# Patient Record
Sex: Female | Born: 1975 | Race: White | Hispanic: Yes | Marital: Married | Smoking: Never smoker
Health system: Southern US, Community
[De-identification: ages and names within clinical notes are randomized; demographics above are authoritative.]

## PROBLEM LIST (undated history)

## (undated) DIAGNOSIS — I10 Essential (primary) hypertension: Secondary | ICD-10-CM

## (undated) DIAGNOSIS — T7840XA Allergy, unspecified, initial encounter: Secondary | ICD-10-CM

## (undated) DIAGNOSIS — E785 Hyperlipidemia, unspecified: Secondary | ICD-10-CM

## (undated) HISTORY — DX: Essential (primary) hypertension: I10

## (undated) HISTORY — DX: Hyperlipidemia, unspecified: E78.5

## (undated) HISTORY — DX: Allergy, unspecified, initial encounter: T78.40XA

---

## 2013-02-20 ENCOUNTER — Other Ambulatory Visit (HOSPITAL_COMMUNITY): Payer: Self-pay | Admitting: *Deleted

## 2013-02-20 ENCOUNTER — Ambulatory Visit (HOSPITAL_COMMUNITY)
Admission: RE | Admit: 2013-02-20 | Discharge: 2013-02-20 | Disposition: A | Discharge status: Home / Self Care | Payer: Self-pay | Source: Ambulatory Visit | Attending: *Deleted | Admitting: *Deleted

## 2013-02-20 DIAGNOSIS — R05 Cough: Secondary | ICD-10-CM | POA: Insufficient documentation

## 2013-02-20 DIAGNOSIS — R059 Cough, unspecified: Secondary | ICD-10-CM | POA: Insufficient documentation

## 2013-02-20 DIAGNOSIS — R918 Other nonspecific abnormal finding of lung field: Secondary | ICD-10-CM | POA: Insufficient documentation

## 2013-02-24 ENCOUNTER — Other Ambulatory Visit (HOSPITAL_COMMUNITY): Payer: Self-pay | Admitting: *Deleted

## 2013-02-24 ENCOUNTER — Ambulatory Visit (HOSPITAL_COMMUNITY)
Admission: RE | Admit: 2013-02-24 | Discharge: 2013-02-24 | Disposition: A | Discharge status: Home / Self Care | Payer: Self-pay | Source: Ambulatory Visit | Attending: *Deleted | Admitting: *Deleted

## 2013-02-24 DIAGNOSIS — J984 Other disorders of lung: Secondary | ICD-10-CM | POA: Insufficient documentation

## 2013-02-24 DIAGNOSIS — R9389 Abnormal findings on diagnostic imaging of other specified body structures: Secondary | ICD-10-CM

## 2015-02-24 IMAGING — CR DG CHEST 2V
2 series · 2 of 2 positions shown · non-contrast
Comparison: None.

CLINICAL DATA: Cough.

EXAM:
CHEST  2 VIEW

[view not recorded (1 of 2)]
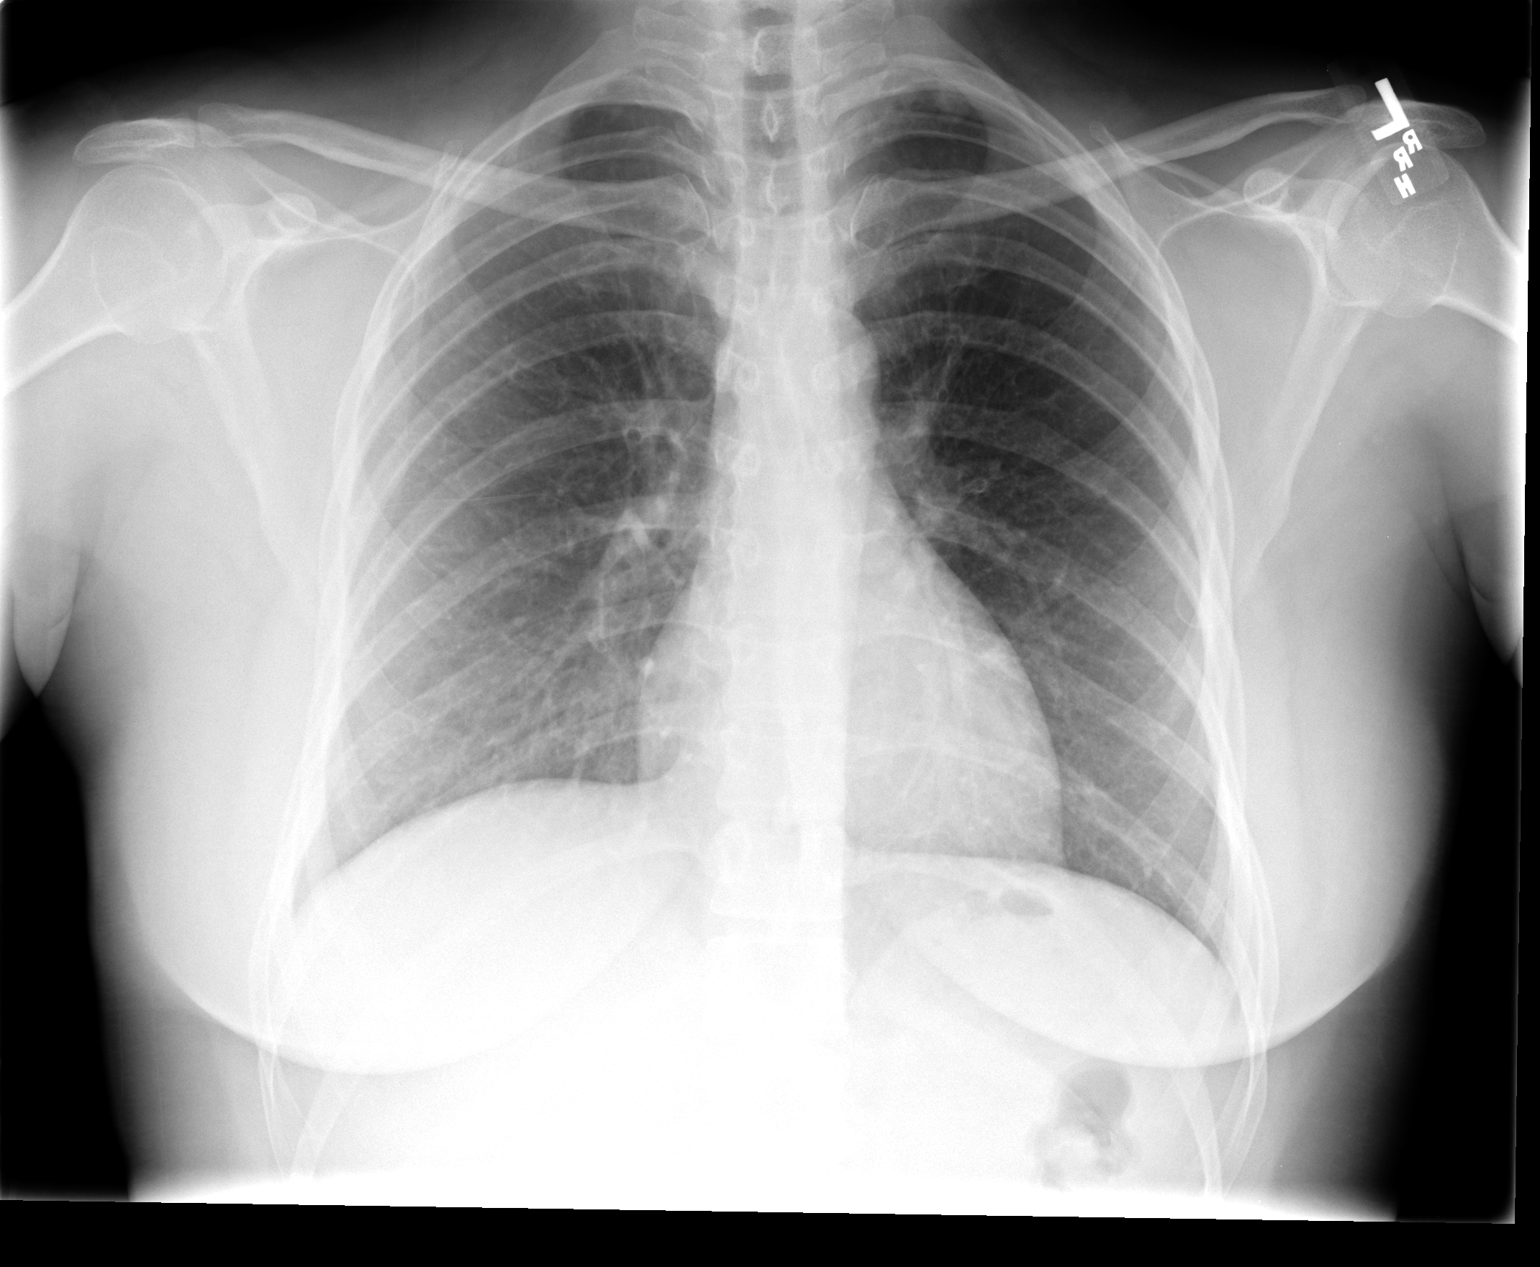

[view not recorded (2 of 2)]
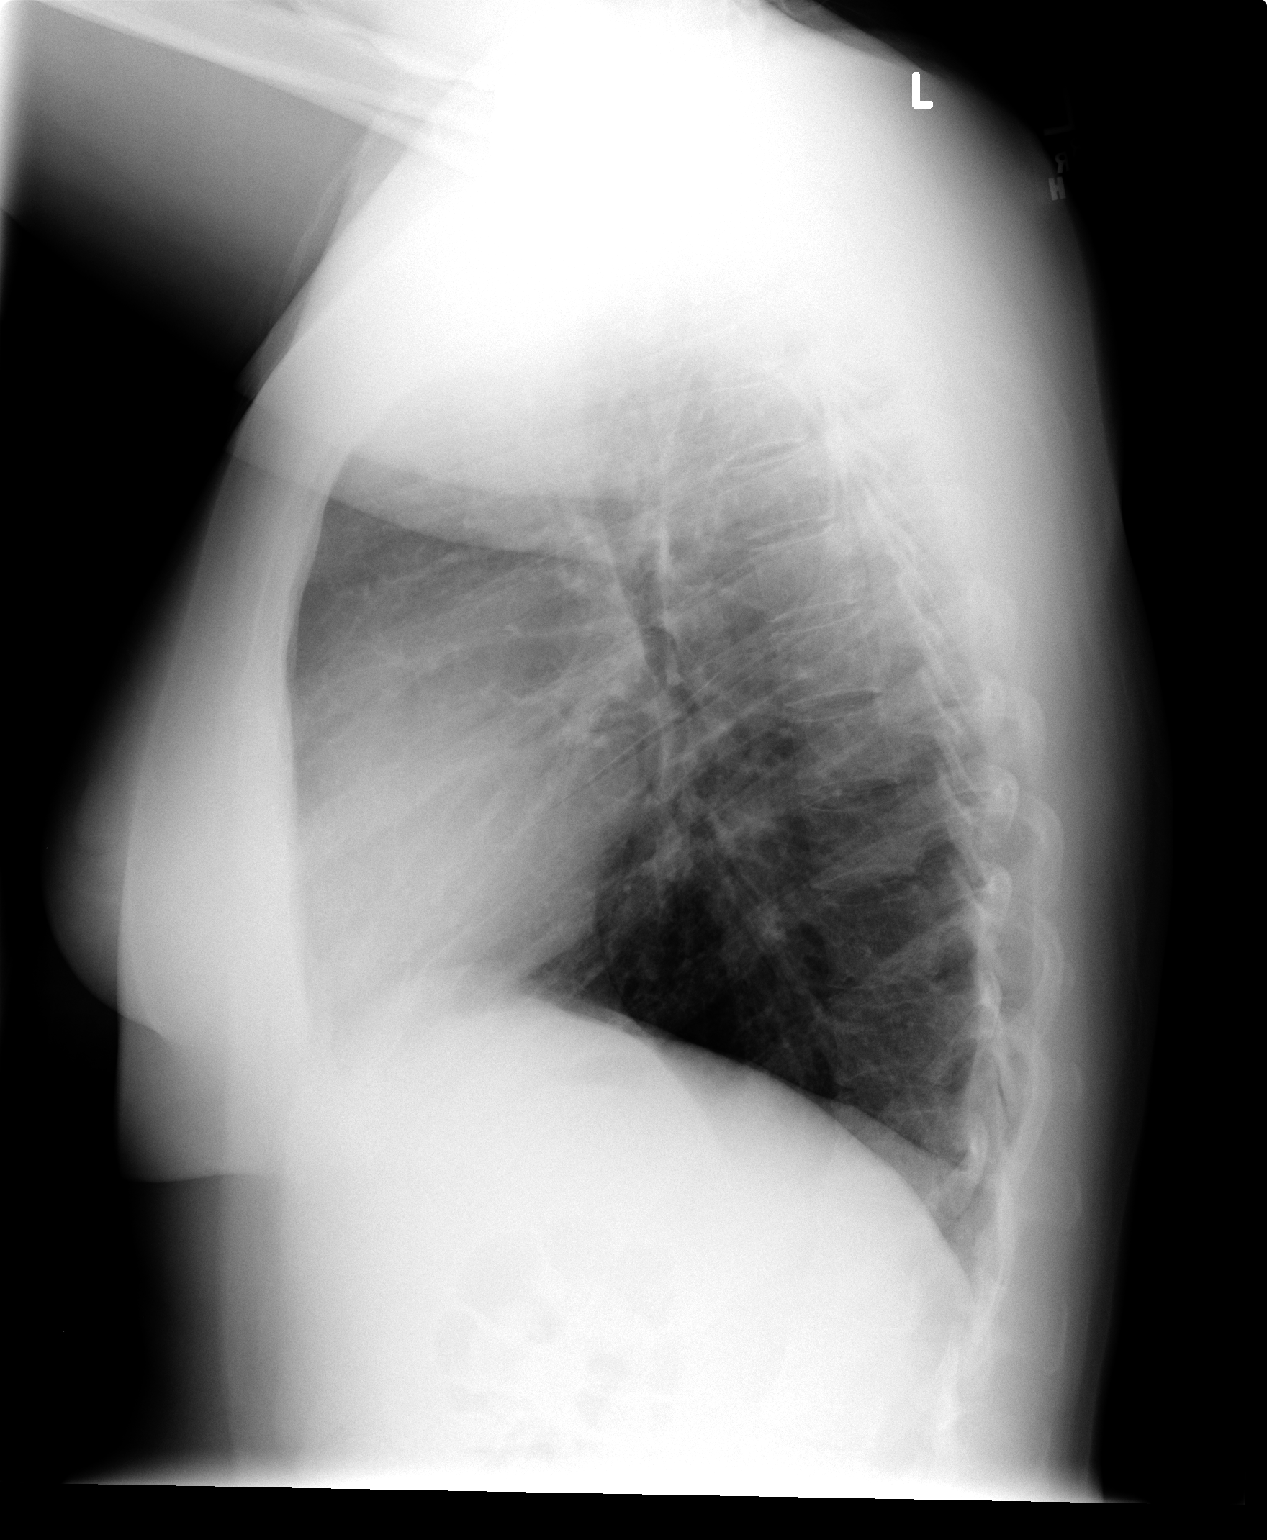

[2 of 2 positions shown; findings below may reference images not displayed]

FINDINGS: The heart size and mediastinal contours are within normal limits.
Right lung is clear. Nodular density is seen projected over the left
lung apex. Otherwise left lung is clear. The visualized skeletal
structures are unremarkable.
IMPRESSION: Nodular density is noted in left lung apex; apical lordotic view is
recommended for further evaluation.

## 2015-08-05 ENCOUNTER — Ambulatory Visit (INDEPENDENT_AMBULATORY_CARE_PROVIDER_SITE_OTHER): Payer: Self-pay | Admitting: Family Medicine

## 2015-08-05 ENCOUNTER — Ambulatory Visit (INDEPENDENT_AMBULATORY_CARE_PROVIDER_SITE_OTHER): Payer: Self-pay

## 2015-08-05 VITALS — BP 128/80 | HR 77 | Temp 98.1°F | Resp 16 | Ht 62.5 in | Wt 189.0 lb

## 2015-08-05 DIAGNOSIS — M79671 Pain in right foot: Secondary | ICD-10-CM

## 2015-08-05 NOTE — Patient Instructions (Addendum)
          IF you received an x-ray today, you will receive an invoice from [facility]. Please contact [facility] at [phone] with questions or concerns regarding your invoice.   IF you received labwork today, you will receive an invoice from [facility]. Please contact [facility] at [phone] with questions or concerns regarding your invoice.   Our billing staff will not be able to assist you with questions regarding bills from these companies.  You will be contacted with the lab results as soon as they are available. The fastest way to get your results is to activate your My Chart account. Instructions are located on the last page of this paperwork. If you have not heard from us regarding the results in 2 weeks, please contact this office.     No broken bones seen, but you  may have an early stress injury/early stress fracture to the heel.  Use crutches to keep weight off of it until the pain is improved, then weight-bear only if tolerated. If your symptoms are not significantly improved in the next week to 2 weeks, would recommend you to see an orthopedic specialist. I can refer you. Ibuprofen if needed.  Return to the clinic or go to the nearest emergency room if any of your symptoms worsen or new symptoms occur.

## 2015-08-05 NOTE — Progress Notes (Signed)
Subjective:  By signing my name below, I, Alexis Johnston, attest that this documentation has been prepared under the direction and in the presence of Alexis Staggers, MD. Electronically Signed: Stann Johnston, Scribe. 08/05/2015 , 9:46 AM .  Patient was seen in Room 1 .   Patient ID: Alexis Johnston, female    DOB: 04/20/1976, 40 y.o.   MRN: [id number] Chief Complaint  Patient presents with  . Foot Pain    Rt Heel pain for few months -NKI-   HPI Alexis Johnston is a 40 y.o. female Patient complains having some right heel pain that's been ongoing for a few months now, "started out of the blue". She states it's gradually gotten worse, and it feels worse when she wakes up in the morning. She feels better after walking around. She's taken ibuprofen twice a day when it's sore. She denies applying ice or topical treatment over the area. She denies any fall or injury.   She also usually have some right calf soreness and causes her to limp. She denies history of DVT's. She denies recent long distance car or air travel. She denies any chest pain. She denies any changes at work.   She works [employer].   There are no active problems to display for this patient.  History reviewed. No pertinent past medical history. Past Surgical History  Procedure Laterality Date  . Cesarean section     No Known Allergies Prior to Admission medications   Not on File   Social History   Social History  . Marital Status: Married    Spouse Name: N/A  . Number of Children: N/A  . Years of Education: N/A   Occupational History  . Not on file.   Social History Main Topics  . Smoking status: Never Smoker   . Smokeless tobacco: Not on file  . Alcohol Use: No  . Drug Use: No  . Sexual Activity: Not on file   Other Topics Concern  . Not on file   Social History Narrative  . No narrative on file   Review of Systems  Constitutional: Negative for fever, chills and fatigue.  Respiratory:  Negative for cough.   Cardiovascular: Negative for chest pain.  Gastrointestinal: Negative for nausea and vomiting.  Musculoskeletal: Positive for myalgias and arthralgias. Negative for joint swelling and gait problem.  Skin: Negative for rash and wound.  Neurological: Negative for weakness and numbness.       Objective:   Physical Exam  Constitutional: She is oriented to person, place, and time. She appears well-developed and well-nourished. No distress.  HENT:  Head: Normocephalic and atraumatic.  Eyes: EOM are normal. Pupils are equal, round, and reactive to light.  Neck: Neck supple.  Cardiovascular: Normal rate.   Pulmonary/Chest: Effort normal. No respiratory distress.  Musculoskeletal: Normal range of motion.  Slight tenderness at the mid to lower calf, achilles non-tender, slight upper calf pain 15cm below patellar calf circumference, left: 39cm  right: 39cm Tender along posterior calcaneus laterally, but most tender over the plantar heel anteriorally, plantar faci only tender at origin, remainder of foot is non tender, positive squeeze test at her calcaneus  Neurological: She is alert and oriented to person, place, and time. No cranial nerve deficit.  Skin: Skin is warm, dry and intact. No ecchymosis noted.  Psychiatric: She has a normal mood and affect. Her behavior is normal.  Nursing note and vitals reviewed.   Filed Vitals:   08/05/15 0902  BP: 128/80  Pulse:  77  Temp: 98.1 F (36.7 C)  TempSrc: Oral  Resp: 16  Height: 5' 2.5" (1.588 m)  Weight: 189 lb (85.73 kg)  SpO2: 98%   Dg Os Calcis Right  08/05/2015  CLINICAL DATA:  Right heel pain.  No known injury. EXAM: RIGHT OS CALCIS - 2+ VIEW COMPARISON:  No recent. FINDINGS: No acute bony or joint abnormality identified. No evidence of fracture or dislocation. Mild calcaneal spurring. IMPRESSION: No acute or focal abnormality.  Mild calcaneal spurring. Electronically Signed   By: Alexis Johnston  Register   On: 08/05/2015  10:44       Assessment & Plan:   Alexis Johnston is a 40 y.o. female Heel pain, right - Plan: DG Os Calcis Right Pain primarily at calcaneus, less likely plantar fasciitis given posterior and lateral symptoms. Achilles appears to be uninvolved. No known change in activity, but stress injury of calcaneus still in differential. Will have her nonweightbearing with crutches until pain improves, then weight-bear as tolerated. If not improving the next 1-2 weeks, recommend orthopedic evaluation. Sooner if worse. Note provided for work for seated work or out if needed X1 week. RTC precautions.  No orders of the defined types were placed in this encounter.   Patient Instructions            IF you received an x-ray today, you will receive an invoice from [facility]. Please contact [facility] at [phone] with questions or concerns regarding your invoice.   IF you received labwork today, you will receive an invoice from [facility]. Please contact [facility] at [phone] with questions or concerns regarding your invoice.   Our billing staff will not be able to assist you with questions regarding bills from these companies.  You will be contacted with the lab results as soon as they are available. The fastest way to get your results is to activate your My Chart account. Instructions are located on the last page of this paperwork. If you have not heard from us regarding the results in 2 weeks, please contact this office.     No broken bones seen, but you  may have an early stress injury/early stress fracture to the heel.  Use crutches to keep weight off of it until the pain is improved, then weight-bear only if tolerated. If your symptoms are not significantly improved in the next week to 2 weeks, would recommend you to see an orthopedic specialist. I can refer you. Ibuprofen if needed.  Return to the clinic or go to the nearest emergency room  if any of your symptoms worsen or new symptoms occur.      I personally performed the services described in this documentation, which was scribed in my presence. The recorded information has been reviewed and considered, and addended by me as needed.

## 2015-08-11 ENCOUNTER — Telehealth: Payer: Self-pay

## 2015-08-11 NOTE — Telephone Encounter (Signed)
Pt needs a updated work note to say she can return back to work on the 17th. That is what the doctor told her when she went to be seen however the note states to return on the 14th  Please advise when ready  [phone]

## 2015-08-12 NOTE — Telephone Encounter (Signed)
Pt advised note ready to pick up. 

## 2016-10-27 ENCOUNTER — Other Ambulatory Visit: Payer: Self-pay | Admitting: Nurse Practitioner

## 2016-10-27 DIAGNOSIS — Z1231 Encounter for screening mammogram for malignant neoplasm of breast: Secondary | ICD-10-CM

## 2016-11-07 ENCOUNTER — Ambulatory Visit
Admission: RE | Admit: 2016-11-07 | Discharge: 2016-11-07 | Disposition: A | Discharge status: Home / Self Care | Payer: No Typology Code available for payment source | Source: Ambulatory Visit | Attending: Nurse Practitioner | Admitting: Nurse Practitioner

## 2016-11-07 DIAGNOSIS — Z1231 Encounter for screening mammogram for malignant neoplasm of breast: Secondary | ICD-10-CM

## 2017-12-06 ENCOUNTER — Other Ambulatory Visit: Payer: Self-pay | Admitting: Nurse Practitioner

## 2017-12-06 DIAGNOSIS — R1900 Intra-abdominal and pelvic swelling, mass and lump, unspecified site: Secondary | ICD-10-CM

## 2017-12-17 ENCOUNTER — Ambulatory Visit
Admission: RE | Admit: 2017-12-17 | Discharge: 2017-12-17 | Disposition: A | Discharge status: Home / Self Care | Payer: No Typology Code available for payment source | Source: Ambulatory Visit | Attending: Nurse Practitioner | Admitting: Nurse Practitioner

## 2017-12-17 DIAGNOSIS — R1900 Intra-abdominal and pelvic swelling, mass and lump, unspecified site: Secondary | ICD-10-CM

## 2019-11-16 IMAGING — US US PELVIS COMPLETE TRANSABD/TRANSVAG
1 series · 14 of 25 positions shown · non-contrast
Comparison: None

CLINICAL DATA: Suprapubic mass on physical exam



[Series 1: us pelvis complete transabd/transvag · 0.25mm/px · 14 of 50 slices shown]
[im 1/50]
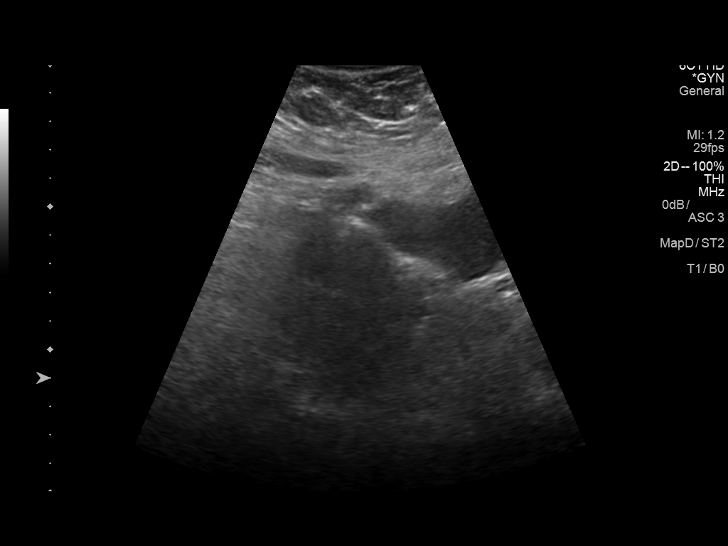
[im 5/50]
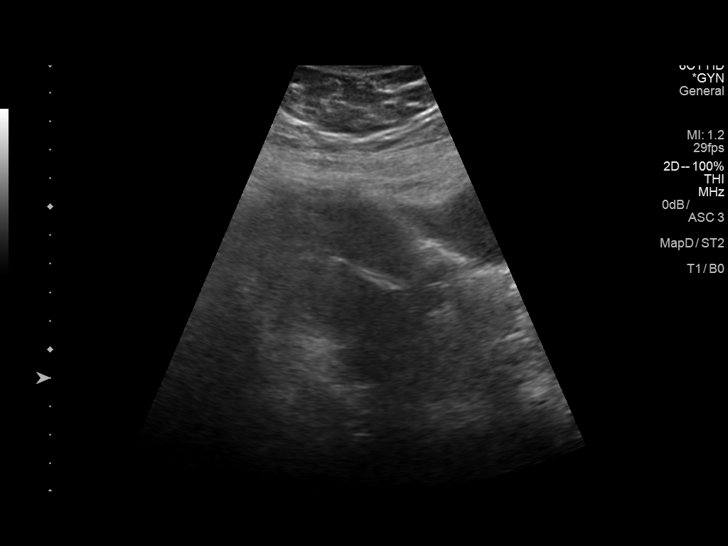
[im 9/50]
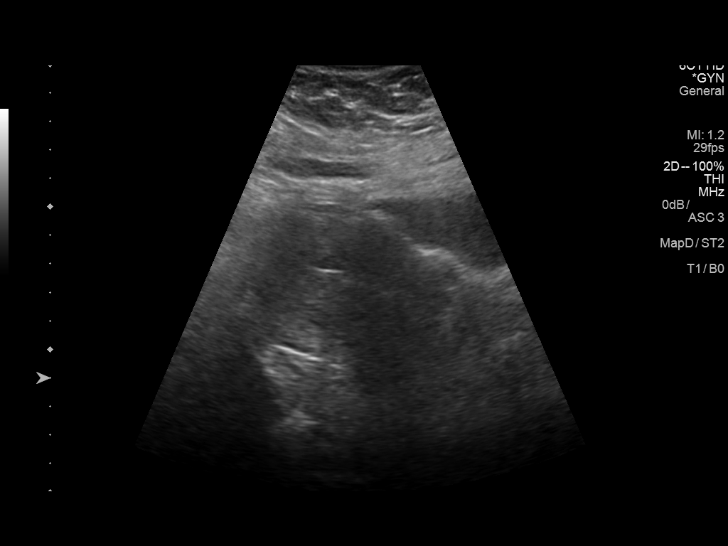
[im 13/50]
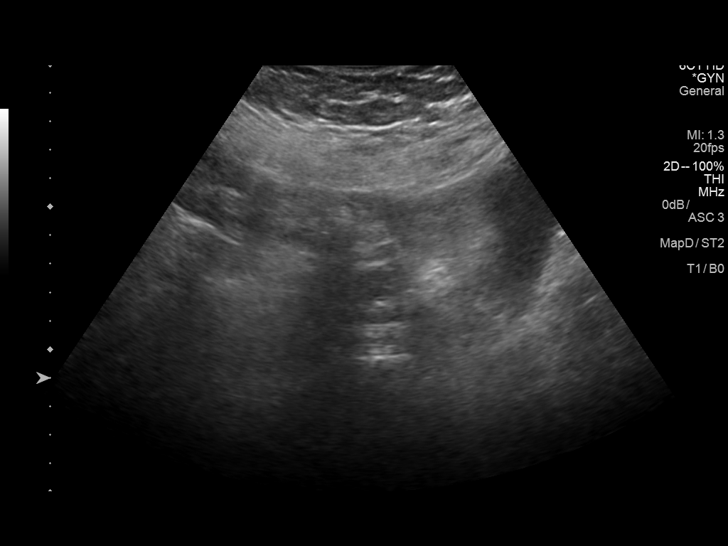
[im 17/50]
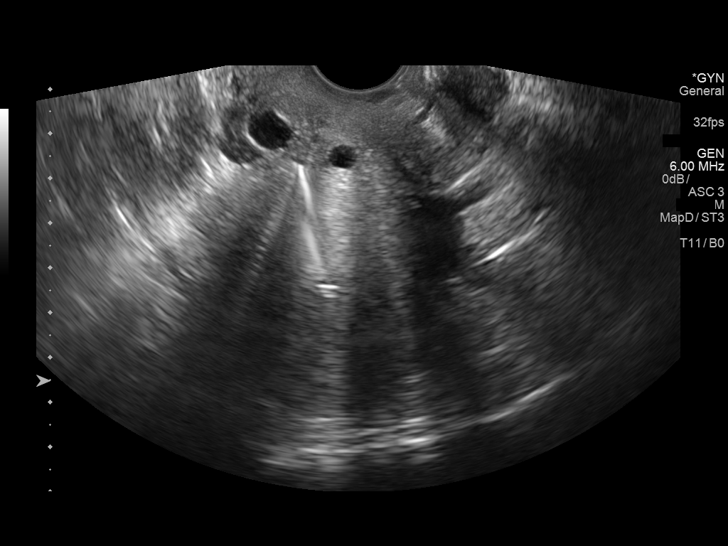
[im 19/50]
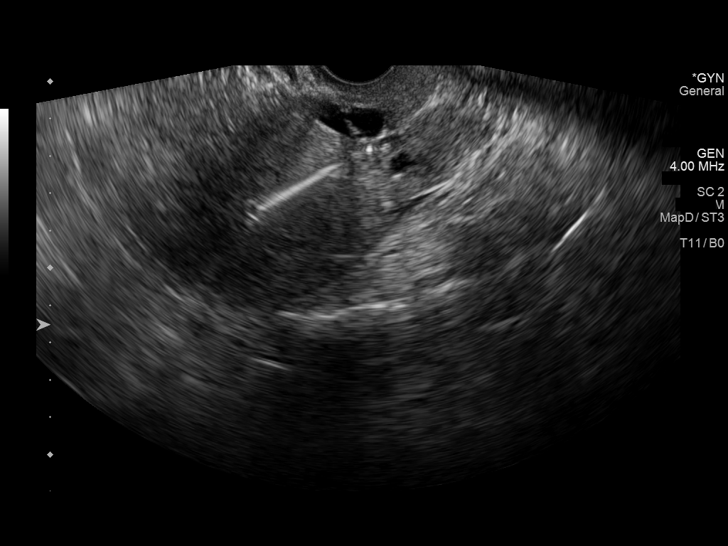
[im 23/50]
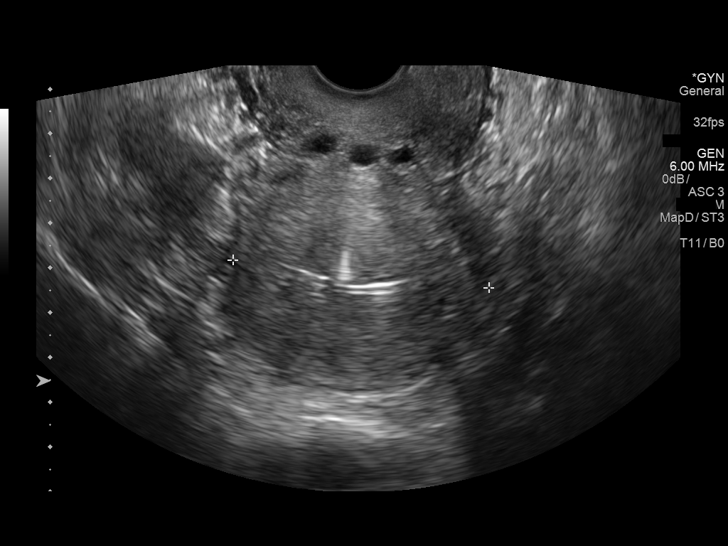
[im 27/50]
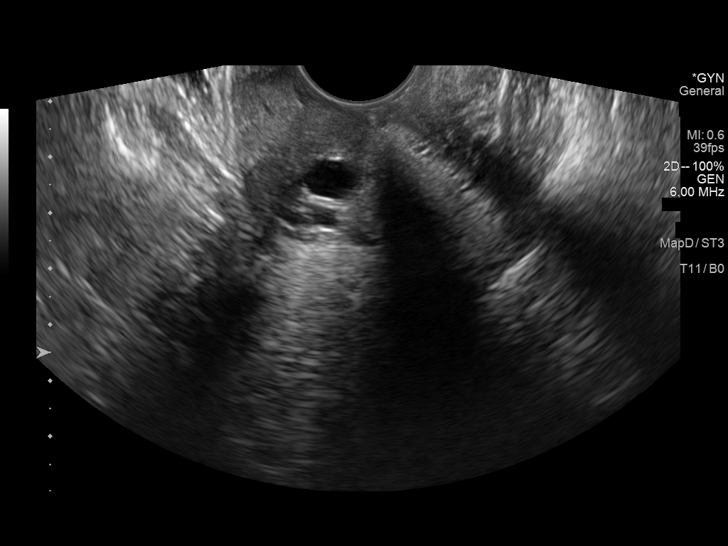
[im 31/50]
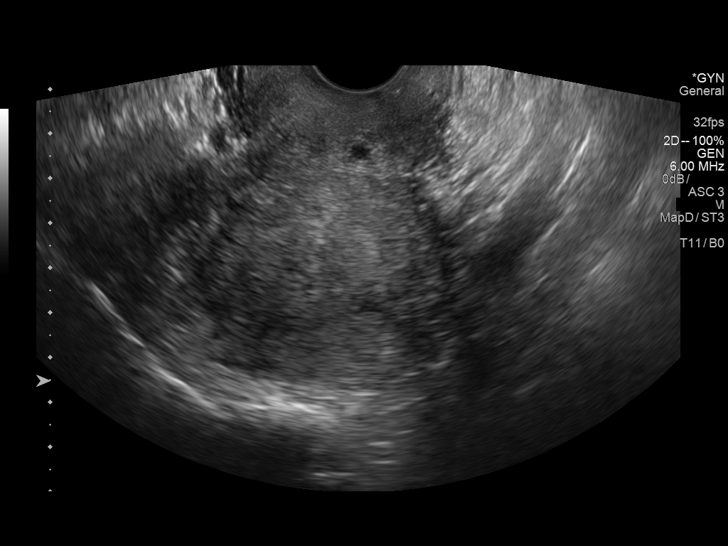
[im 33/50]
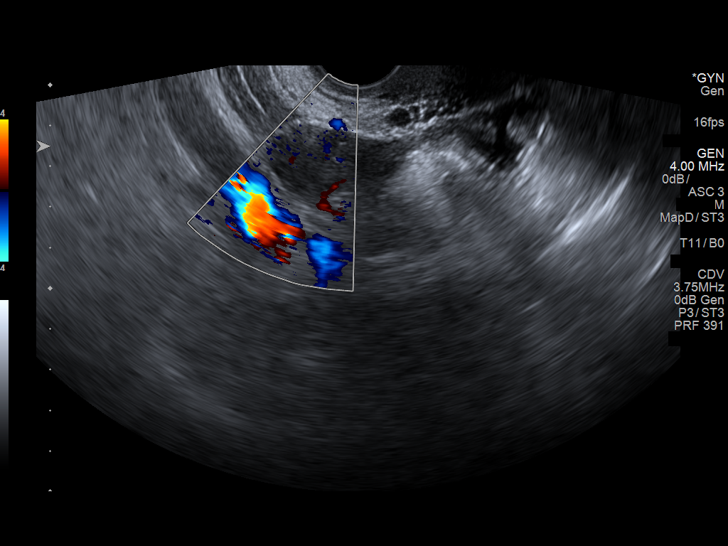
[im 37/50]
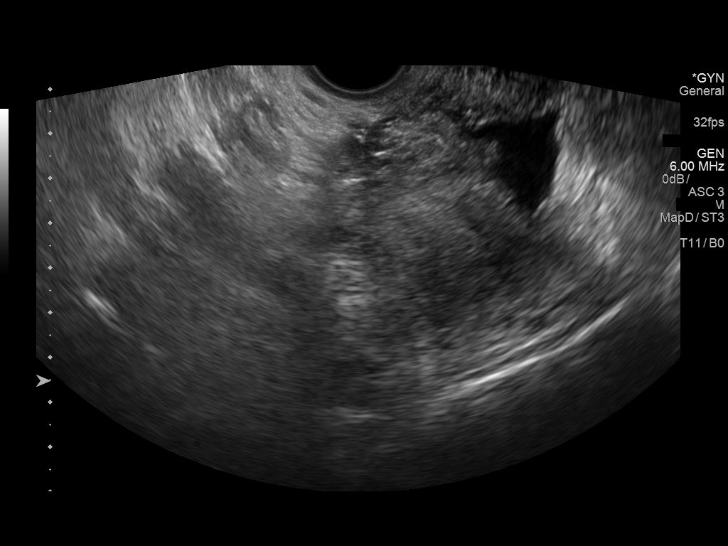
[im 41/50]
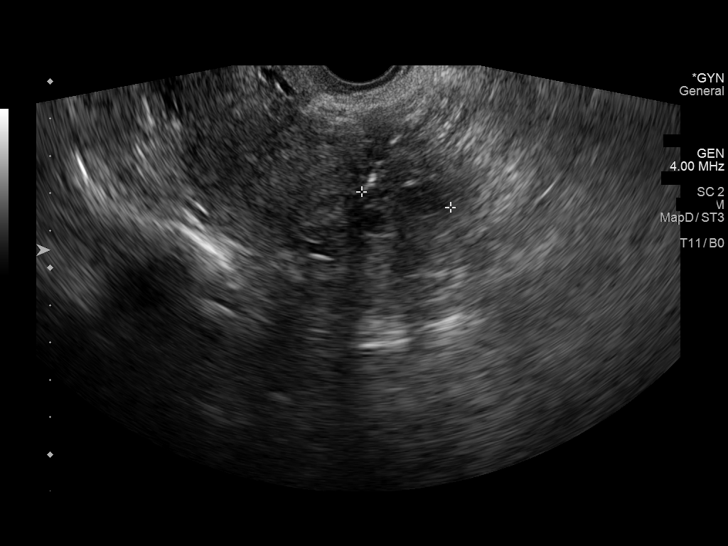
[im 45/50]
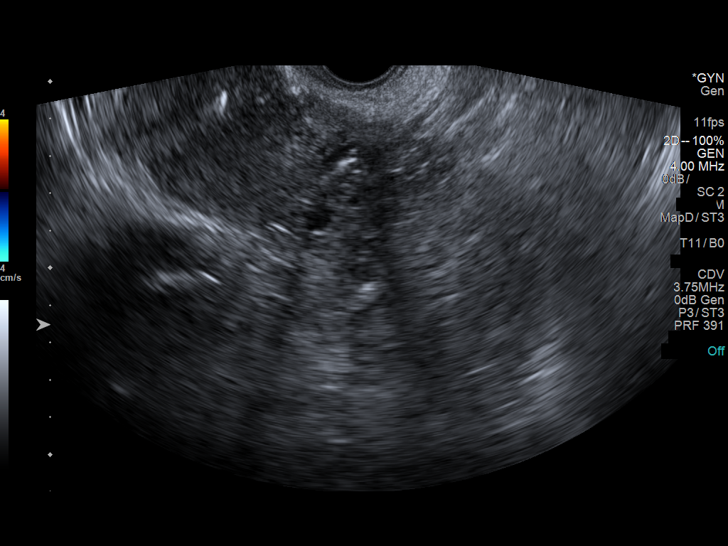
[im 50/50]
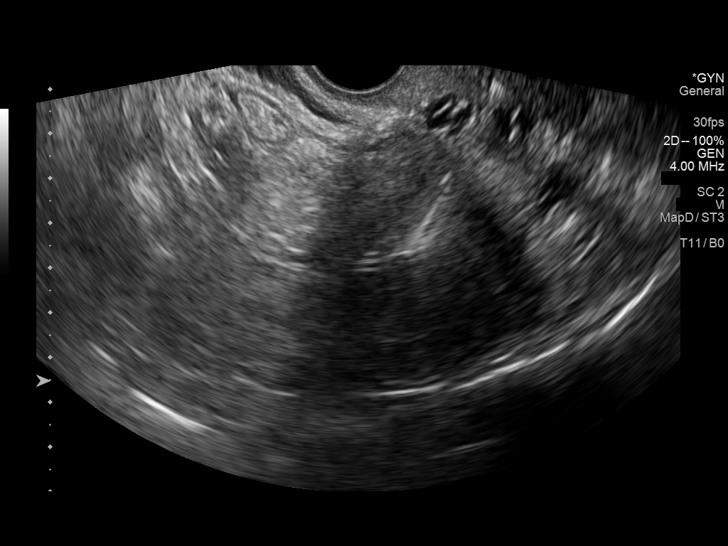

[14 of 25 positions shown; findings below may reference images not displayed]

FINDINGS: Uterus

Measurements: 8 x 5 x 6 cm. No fibroids or other mass visualized.
Nabothian cysts are present.

Endometrium

IUD in expected position.  No visualized mass.

Right ovary

Measurements: 27 x 17 x 18 mm.  Normal appearance/no adnexal mass.

Left ovary

Measurements: 35 x 24 x 24 mm.  Normal appearance/no adnexal mass.

Other findings

Trace pelvic free fluid which could be physiologic.
IMPRESSION: 1. Negative for pelvic mass.
2. IUD in expected position.

## 2023-03-02 ENCOUNTER — Other Ambulatory Visit: Payer: Self-pay | Admitting: Nurse Practitioner

## 2023-03-02 DIAGNOSIS — Z1231 Encounter for screening mammogram for malignant neoplasm of breast: Secondary | ICD-10-CM

## 2023-03-12 ENCOUNTER — Encounter (HOSPITAL_COMMUNITY): Payer: Self-pay

## 2023-03-12 ENCOUNTER — Ambulatory Visit (HOSPITAL_COMMUNITY)
Admission: RE | Admit: 2023-03-12 | Discharge: 2023-03-12 | Disposition: A | Discharge status: Home / Self Care | Payer: No Typology Code available for payment source | Source: Ambulatory Visit | Attending: Nurse Practitioner | Admitting: Nurse Practitioner

## 2023-03-12 DIAGNOSIS — Z1231 Encounter for screening mammogram for malignant neoplasm of breast: Secondary | ICD-10-CM | POA: Insufficient documentation

## 2023-03-15 ENCOUNTER — Telehealth: Payer: Self-pay

## 2023-03-15 NOTE — Telephone Encounter (Signed)
Telephoned patient at mobile number using interpreter, Delorise Royals. Left patient a voice message with [facility] (scholarship call back) contact information.

## 2023-03-22 ENCOUNTER — Other Ambulatory Visit (HOSPITAL_COMMUNITY): Payer: Self-pay | Admitting: Obstetrics and Gynecology

## 2023-03-22 ENCOUNTER — Encounter (HOSPITAL_COMMUNITY): Payer: Self-pay | Admitting: Obstetrics and Gynecology

## 2023-03-22 DIAGNOSIS — R928 Other abnormal and inconclusive findings on diagnostic imaging of breast: Secondary | ICD-10-CM

## 2023-03-23 ENCOUNTER — Inpatient Hospital Stay
Payer: No Typology Code available for payment source | Attending: Obstetrics and Gynecology | Admitting: Hematology and Oncology

## 2023-03-23 VITALS — BP 166/98 | Wt 201.1 lb

## 2023-03-23 DIAGNOSIS — N6310 Unspecified lump in the right breast, unspecified quadrant: Secondary | ICD-10-CM

## 2023-03-23 NOTE — Progress Notes (Signed)
Alexis Johnston is a 47 y.o. female who presents to [facility] clinic today with no complaints. Callback for possible mass in the right breast.   Pap Smear: Pap not smear completed today. Last Pap smear was 03/01/2023 at [facility] clinic and was  unsatisfactoy . Per patient has no history of an abnormal Pap smear. Last Pap smear result is available in Epic. She is scheduled to repeat Pap smear 04/12/2023   Physical exam: Breasts Breasts symmetrical. No skin abnormalities bilateral breasts. No nipple retraction bilateral breasts. No nipple discharge bilateral breasts. No lymphadenopathy. No lumps palpated bilateral breasts.     MS 3D SCR MAMMO BILAT BR (aka MM)  Result Date: 03/15/2023 CLINICAL DATA:  Screening. EXAM: DIGITAL SCREENING BILATERAL MAMMOGRAM WITH TOMOSYNTHESIS AND CAD TECHNIQUE: Bilateral screening digital craniocaudal and mediolateral oblique mammograms were obtained. Bilateral screening digital breast tomosynthesis was performed. The images were evaluated with computer-aided detection. COMPARISON:  Previous exam(s). ACR Breast Density Category c: The breasts are heterogeneously dense, which may obscure small masses. FINDINGS: In the right breast, a possible mass warrants further evaluation. In the left breast, no findings suspicious for malignancy. IMPRESSION: Further evaluation is suggested for a possible mass in the right breast. RECOMMENDATION: Diagnostic mammogram and possibly ultrasound of the right breast. (Code:[id number]-64M) The patient will be contacted regarding the findings, and additional imaging will be scheduled. BI-RADS CATEGORY  0: Incomplete: Need additional imaging evaluation. Electronically Signed   By: Norva Pavlov M.D.   On: 03/15/2023 09:09      Pelvic/Bimanual Pap is not indicated today    Smoking History: Patient has never smoked and was not referred to quit line.    Patient Navigation: Patient education provided. Access to services provided for patient  through [facility] program. Alexis Johnston interpreter provided. No transportation provided   Colorectal Cancer Screening: Per patient has never had colonoscopy completed No complaints today. FIT test completed 03/01/2023   Breast and Cervical Cancer Risk Assessment: Patient does not have family history of breast cancer, known genetic mutations, or radiation treatment to the chest before age 25. Patient does not have history of cervical dysplasia, immunocompromised, or DES exposure in-utero.  Risk Scores as of Encounter on 03/23/2023     Alexis Johnston           5-year 0.63%   Lifetime 6.82%   This patient is [place] but has no documented birth country, so the [place] model used data from [place] patients to calculate their risk score. Document a birth country in the Demographics activity for a more accurate score.         Last calculated by Alexis Rutherford, LPN on 40/98/1191 at 11:25 AM        A: [facility] exam without pap smear No complaints with benign exam. Callback from screening mammogram for possible right breast mass.  P: Referred patient to the [name] for a diagnostic mammogram. Appointment scheduled 04/19/2023.  Alexis Lux, NP 03/23/2023 11:31 AM

## 2023-03-23 NOTE — Patient Instructions (Addendum)
Taught [name] about self breast awareness and gave educational materials to take home. Patient did not need a Pap smear today due to last Pap smear was in 03/01/2023 and will be repeated 04/12/2023 due to unsatisfactory results per patient. Let her know [facility] will cover Pap smears every 5 years unless has a history of abnormal Pap smears. Referred patient to the [name] for diagnostic mammogram. Appointment scheduled for 04/19/23. Patient aware of appointment and will be there. Let patient know will follow up with her within the next couple weeks with results. Alexis Johnston verbalized understanding.  [name], NP 10:56 AM

## 2023-04-19 ENCOUNTER — Ambulatory Visit (HOSPITAL_COMMUNITY)
Admission: RE | Admit: 2023-04-19 | Discharge: 2023-04-19 | Disposition: A | Discharge status: Home / Self Care | Payer: No Typology Code available for payment source | Source: Ambulatory Visit | Attending: Obstetrics and Gynecology | Admitting: Obstetrics and Gynecology

## 2023-04-19 ENCOUNTER — Encounter (HOSPITAL_COMMUNITY): Payer: Self-pay

## 2023-04-19 DIAGNOSIS — R928 Other abnormal and inconclusive findings on diagnostic imaging of breast: Secondary | ICD-10-CM
